# Patient Record
Sex: Male | Born: 1990 | Hispanic: No | Marital: Single | Smoking: Current every day smoker
Health system: Southern US, Community
[De-identification: ages and names within clinical notes are randomized; demographics above are authoritative.]

## PROBLEM LIST (undated history)

## (undated) DIAGNOSIS — F419 Anxiety disorder, unspecified: Secondary | ICD-10-CM

## (undated) DIAGNOSIS — L509 Urticaria, unspecified: Secondary | ICD-10-CM

## (undated) DIAGNOSIS — N189 Chronic kidney disease, unspecified: Secondary | ICD-10-CM

## (undated) DIAGNOSIS — J302 Other seasonal allergic rhinitis: Secondary | ICD-10-CM

## (undated) DIAGNOSIS — T7840XA Allergy, unspecified, initial encounter: Secondary | ICD-10-CM

## (undated) HISTORY — DX: Allergy, unspecified, initial encounter: T78.40XA

## (undated) HISTORY — DX: Urticaria, unspecified: L50.9

## (undated) HISTORY — PX: CYST REMOVAL NECK: SHX6281

---

## 2016-05-11 ENCOUNTER — Emergency Department (HOSPITAL_COMMUNITY)
Admission: EM | Admit: 2016-05-11 | Discharge: 2016-05-11 | Disposition: A | Discharge status: Home / Self Care | Payer: Managed Care, Other (non HMO) | Attending: Emergency Medicine | Admitting: Emergency Medicine

## 2016-05-11 ENCOUNTER — Emergency Department (HOSPITAL_COMMUNITY): Payer: Managed Care, Other (non HMO)

## 2016-05-11 ENCOUNTER — Encounter (HOSPITAL_COMMUNITY): Payer: Self-pay | Admitting: *Deleted

## 2016-05-11 DIAGNOSIS — Y999 Unspecified external cause status: Secondary | ICD-10-CM | POA: Diagnosis not present

## 2016-05-11 DIAGNOSIS — S61412A Laceration without foreign body of left hand, initial encounter: Secondary | ICD-10-CM

## 2016-05-11 DIAGNOSIS — Y929 Unspecified place or not applicable: Secondary | ICD-10-CM | POA: Diagnosis not present

## 2016-05-11 DIAGNOSIS — Y9389 Activity, other specified: Secondary | ICD-10-CM | POA: Insufficient documentation

## 2016-05-11 DIAGNOSIS — W260XXA Contact with knife, initial encounter: Secondary | ICD-10-CM | POA: Insufficient documentation

## 2016-05-11 DIAGNOSIS — F17200 Nicotine dependence, unspecified, uncomplicated: Secondary | ICD-10-CM | POA: Diagnosis not present

## 2016-05-11 HISTORY — DX: Other seasonal allergic rhinitis: J30.2

## 2016-05-11 MED ORDER — TETANUS-DIPHTH-ACELL PERTUSSIS 5-2.5-18.5 LF-MCG/0.5 IM SUSP
0.5000 mL | Freq: Once | INTRAMUSCULAR | Status: AC
  Administered 2016-05-11: 0.5 mL via INTRAMUSCULAR
  Filled 2016-05-11: qty 0.5

## 2016-05-11 MED ORDER — CEPHALEXIN 500 MG PO CAPS: 500.0000 mg | ORAL_CAPSULE | Freq: Four times a day (QID) | ORAL | 0 refills | Status: DC

## 2016-05-11 MED ORDER — LIDOCAINE-EPINEPHRINE 1 %-1:100000 IJ SOLN
10.0000 mL | Freq: Once | INTRAMUSCULAR | Status: AC
  Administered 2016-05-11: 10 mL via INTRADERMAL
  Filled 2016-05-11: qty 1

## 2016-05-11 NOTE — ED Notes (Signed)
Dahlia ClientHannah, PA at  Bedside suturing laceration at this time.

## 2016-05-11 NOTE — ED Provider Notes (Signed)
[facility]-EMERGENCY DEPT Provider Note   CSN: [id number] Arrival date & time: 05/11/16  4098  First Provider Contact:  First MD Initiated Contact with Patient 05/11/16 0154        History   Chief Complaint Chief Complaint  Patient presents with  . Laceration    HPI Carlos Bradford is a 25 y.o. male.  Carlos Bradford is a 25 y.o. male  with a hx of seasonal allergies presents to the Emergency Department complaining of acute, persistent, laceration to the left hand between the middle and ring fingers.  Denies numbness or weakness.  He reports he "stabbed" his hand with the knife while trying to cut a strawberry.  No aggravating or alleviating factors.     The history is provided by the patient and medical records. No language interpreter was used.  Laceration     Past Medical History:  Diagnosis Date  . Seasonal allergies     There are no active problems to display for this patient.   Past Surgical History:  Procedure Laterality Date  . CYST REMOVAL NECK         Home Medications    Prior to Admission medications   Medication Sig Start Date End Date Taking? Authorizing Provider  cephALEXin (KEFLEX) 500 MG capsule Take 1 capsule (500 mg total) by mouth 4 (four) times daily. 05/11/16   Dahlia Client Kalub Morillo, PA-C    Family History No family history on file.  Social History Social History  Substance Use Topics  . Smoking status: Current Some Day Smoker  . Smokeless tobacco: Never Used  . Alcohol use No     Allergies   Review of patient's allergies indicates no known allergies.   Review of Systems Review of Systems  Constitutional: Negative for fever.  Gastrointestinal: Negative for nausea and vomiting.  Skin: Positive for wound.  Allergic/Immunologic: Negative for immunocompromised state.  Neurological: Negative for weakness and numbness.  Hematological: Does not bruise/bleed easily.  Psychiatric/Behavioral: The patient is not nervous/anxious.      Physical  Exam Updated Vital Signs BP 121/75 (BP Location: Right Arm)   Pulse 67   Temp 98.5 F (36.9 C) (Oral)   Resp 18   Ht  (1.88 m)   Wt 77.1 kg   SpO2 97%   BMI 21.83 kg/m   Physical Exam  Constitutional: He is oriented to person, place, and time. He appears well-developed and well-nourished. No distress.  HENT:  Head: Normocephalic and atraumatic.  Eyes: Conjunctivae are normal. No scleral icterus.  Neck: Normal range of motion.  Cardiovascular: Normal rate, regular rhythm, normal heart sounds and intact distal pulses.   No murmur heard. Capillary refill < 3 sec  Pulmonary/Chest: Effort normal and breath sounds normal. No respiratory distress.  Musculoskeletal: Normal range of motion. He exhibits no edema.  Right hand: Full range of motion of all fingers of the right hand.  Dentition intact to dull and sharp in all fingers.  Strength 5/5 with flexion and extension of all fingers. Strong grip strength.  2.7cm laceration to the palm of the hand between the middle and ring fingers.    Neurological: He is alert and oriented to person, place, and time.  Skin: Skin is warm and dry. He is not diaphoretic.  Psychiatric: He has a normal mood and affect.  Nursing note and vitals reviewed.    ED Treatments / Results  Labs (all labs ordered are listed, but only abnormal results are displayed) Labs Reviewed - No data to display  EKG  EKG Interpretation None       Radiology Dg Hand 2 View Left  Result Date: 05/11/2016 CLINICAL DATA:  Hand injury. Stabbed anterior surface of hand with knife making smoothie; laceration. EXAM: LEFT HAND - 2 VIEW COMPARISON:  None. FINDINGS: There is no evidence of fracture or dislocation. There is no evidence of arthropathy or other focal bone abnormality. Soft tissues are unremarkable. No radiopaque foreign body. IMPRESSION: Negative radiographs of the left hand. Electronically Signed   By: Rubye Oaks M.D.   On: 05/11/2016 02:39     Procedures .Marland KitchenLaceration Repair Date/Time: 05/11/2016 3:43 AM Performed by: Dierdre Forth Authorized by: Dierdre Forth   Consent:    Consent obtained:  Verbal   Consent given by:  Patient   Risks discussed:  Infection and poor wound healing   Alternatives discussed:  No treatment Anesthesia (see MAR for exact dosages):    Anesthesia method:  Local infiltration   Local anesthetic:  Lidocaine 1% WITH epi Laceration details:    Location:  Hand   Hand location:  L palm   Length (cm):  2.7 Repair type:    Repair type:  Simple Pre-procedure details:    Preparation:  Patient was prepped and draped in usual sterile fashion Exploration:    Hemostasis achieved with:  Direct pressure and epinephrine   Wound exploration: wound explored through full range of motion     Contaminated: no   Treatment:    Area cleansed with:  Saline and soap and water   Amount of cleaning:  Standard   Irrigation solution:  Sterile saline and sterile water   Irrigation volume:    Irrigation method:  Syringe   Visualized foreign bodies/material removed: no   Skin repair:    Repair method:  Sutures   Suture size:  5-0   Suture material:  Prolene   Suture technique:  Simple interrupted   Number of sutures:  3 Approximation:    Approximation:  Close   Vermilion border: well-aligned   Post-procedure details:    Dressing:  Non-adherent dressing   Patient tolerance of procedure:  Tolerated well, no immediate complications   (including critical care time)  Medications Ordered in ED Medications  lidocaine-EPINEPHrine (XYLOCAINE W/EPI) 1 %-1:100000 (with pres) injection 10 mL (10 mLs Intradermal Given by Other 05/11/16 0302)  Tdap (BOOSTRIX) injection 0.5 mL (0.5 mLs Intramuscular Given 05/11/16 0303)     Initial Impression / Assessment and Plan / ED Course  I have reviewed the triage vital signs and the nursing notes.  Pertinent labs & imaging results that were available during my  care of the patient were reviewed by me and considered in my medical decision making (see chart for details).  Clinical Course  Value Comment By Time  DG Hand 2 View Left No foreign bodies or fractures Dierdre Forth, PA-C 08/06 0244    Pt with laceration to the hand.  NO clinical evidence of vascular or tendon damage.  Pressure irrigation performed. Wound explored and base of wound visualized in a bloodless field without evidence of foreign body.  Laceration occurred < 8 hours prior to repair which was well tolerated. Tdap updated.  Pt has no comorbidities to effect normal wound healing. Pt discharged with antibiotics.  Discussed suture home care with patient and answered questions. Pt to follow-up for wound check and suture removal in 7 days; they are to return to the ED sooner for signs of infection. Pt is hemodynamically stable with no complaints prior  to dc.    Final Clinical Impressions(s) / ED Diagnoses   Final diagnoses:  Hand laceration, left, initial encounter    New Prescriptions New Prescriptions   CEPHALEXIN (KEFLEX) 500 MG CAPSULE    Take 1 capsule (500 mg total) by mouth 4 (four) times daily.     Dahlia ClientHannah Masayuki Sakai, PA-C 05/11/16 [id number]    Tomasita CrumbleAdeleke Oni, MD 05/11/16 [phone]

## 2016-05-11 NOTE — ED Triage Notes (Signed)
Patient presents with cut to left palm of hand from knife while cutting a strawberry

## 2016-05-11 NOTE — Discharge Instructions (Signed)

## 2016-07-25 ENCOUNTER — Encounter: Payer: Self-pay | Admitting: Family Medicine

## 2016-07-25 ENCOUNTER — Encounter (INDEPENDENT_AMBULATORY_CARE_PROVIDER_SITE_OTHER): Payer: Self-pay

## 2016-07-25 ENCOUNTER — Ambulatory Visit (INDEPENDENT_AMBULATORY_CARE_PROVIDER_SITE_OTHER): Payer: Managed Care, Other (non HMO) | Admitting: Family Medicine

## 2016-07-25 VITALS — BP 110/78 | HR 97 | Resp 12 | Ht 74.0 in | Wt 177.5 lb

## 2016-07-25 DIAGNOSIS — R5383 Other fatigue: Secondary | ICD-10-CM | POA: Diagnosis not present

## 2016-07-25 DIAGNOSIS — J309 Allergic rhinitis, unspecified: Secondary | ICD-10-CM

## 2016-07-25 DIAGNOSIS — Z23 Encounter for immunization: Secondary | ICD-10-CM

## 2016-07-25 DIAGNOSIS — Z0001 Encounter for general adult medical examination with abnormal findings: Secondary | ICD-10-CM

## 2016-07-25 DIAGNOSIS — Z113 Encounter for screening for infections with a predominantly sexual mode of transmission: Secondary | ICD-10-CM

## 2016-07-25 DIAGNOSIS — J3089 Other allergic rhinitis: Secondary | ICD-10-CM | POA: Insufficient documentation

## 2016-07-25 DIAGNOSIS — Z0000 Encounter for general adult medical examination without abnormal findings: Secondary | ICD-10-CM

## 2016-07-25 MED ORDER — MONTELUKAST SODIUM 10 MG PO TABS: 10.0000 mg | ORAL_TABLET | Freq: Every day | ORAL | 2 refills | Status: DC

## 2016-07-25 NOTE — Progress Notes (Signed)
HPI:   Zaivion Kundrat is a 25 y.o. male, who is here today to establish care with me. He is requesting routine physical today, according to him, he arranged appt for a CPE.  Former PCP: N/A Last preventive routine visit: Many years ago.  He plays tennis once per week for 2-3 hours. He does not follow a healthful diet.  Hx of STD's: Denies. He is not currently sexually active. He also would like screening for STD's, states that he always wears condoms. He denies any dysuria, urethral discharged, known exposure, or genital lesions.  Lives alone.     Concerns today: fatigue, worse for the past month, not sure for how long it has been going on but he recalls he did not have it in 01/2016 when he started working. Fatigue is usually first thing in the morning when he wakes up. He feels like he did not sleep at all.  Sometimes he feels a "little" cold, usually when he does not get a good sleep, stays late frequently.  He denies any Hx of depression or anxiety. 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 He did counseling.  Loud snoring while in college 2016, attributed to be very sleepy after a few days of not sleeping enough.   Hx of allergic rhinitis, he is on Flonase nasal spray, started 2 months ago. He feels like his allergies are "bad", he would like to see a allergy specialist. + Frontal pressure headache and nasal congestion. + nose and eyes pruritus, sneezing.    He denies fever, abnormal wt loss, cough, dyspnea, abdominal pain, nausea, vomiting, changes in bowel habits, blood in stool, gross hematuria, arthralgias, myalgias, or skin rash. No headache, numbness, or weakness.   Review of Systems  Constitutional: Positive for fatigue. Negative for activity change, appetite change, chills, diaphoresis, fever and unexpected weight change.  HENT: Positive for postnasal drip, rhinorrhea and sneezing. Negative for dental problem, ear  pain, hearing loss, mouth sores, nosebleeds, sore throat, trouble swallowing and voice change.   Eyes: Positive for discharge and itching. Negative for pain, redness and visual disturbance.  Respiratory: Negative for apnea, cough, shortness of breath and wheezing.   Cardiovascular: Negative for chest pain, palpitations and leg swelling.  Gastrointestinal: Negative for abdominal pain, blood in stool, nausea and vomiting.       No changes in bowel habits.  Endocrine: Negative for cold intolerance, heat intolerance, polydipsia, polyphagia and polyuria.  Genitourinary: Negative for decreased urine volume, difficulty urinating, discharge, dysuria, frequency, genital sores, hematuria, penile swelling, testicular pain and urgency.  Musculoskeletal: Negative for arthralgias, back pain, joint swelling and myalgias.  Skin: Negative for color change and rash.  Allergic/Immunologic: Positive for environmental allergies.  Neurological: Positive for headaches. Negative for dizziness, tremors, syncope, facial asymmetry, speech difficulty, weakness and numbness.  Hematological: Negative for adenopathy. Does not bruise/bleed easily.  Psychiatric/Behavioral: Negative for confusion and sleep disturbance. The patient is nervous/anxious.       No current outpatient prescriptions on file prior to visit.   No current facility-administered medications on file prior to visit.      Past Medical History:  Diagnosis Date  . Allergy   . Seasonal allergies    No Known Allergies  Family History  Problem Relation Age of Onset  . Hypertension Father   . Stroke Paternal Grandmother     Social History   Social History  . Marital status: Single  Spouse name: N/A  . Number of children: N/A  . Years of education: N/A   Social History Main Topics  . Smoking status: Current Some Day Smoker  . Smokeless tobacco: Never Used  . Alcohol use No  . Drug use:     Types: Marijuana     Comment: in the past  .  Sexual activity: Not Asked   Other Topics Concern  . None   Social History Narrative  . None    Vitals:   07/25/16 1339  BP: 110/78  Pulse: 97  Resp: 12   O2 sat at RA 98%   Body mass index is 22.79 kg/m.     Physical Exam  Nursing note and vitals reviewed. Constitutional: He is oriented to person, place, and time. He appears well-developed and well-nourished. No distress.  HENT:  Head: Atraumatic.  Nose: Rhinorrhea present.  Mouth/Throat: Uvula is midline, oropharynx is clear and moist and mucous membranes are normal.  Nasal voice, post nasal drainage  Eyes: Conjunctivae and EOM are normal. Pupils are equal, round, and reactive to light.  Neck: Normal range of motion. No thyroid mass and no thyromegaly present.  Cardiovascular: Normal rate and regular rhythm.   No murmur heard. Pulses:      Dorsalis pedis pulses are 2+ on the right side, and 2+ on the left side.  Respiratory: Effort normal and breath sounds normal. No respiratory distress.  GI: Soft. He exhibits no mass. There is no tenderness.  Genitourinary:  Genitourinary Comments: No concerns.  Musculoskeletal: He exhibits no edema or tenderness.  No major deformities appreciated and no signs of synovitis.  Lymphadenopathy:    He has no cervical adenopathy.       Right: No supraclavicular adenopathy present.       Left: No supraclavicular adenopathy present.  Neurological: He is alert and oriented to person, place, and time. He has normal strength. No cranial nerve deficit or sensory deficit. Coordination and gait normal.  Skin: Skin is warm. No rash noted. No erythema.  Psychiatric: His speech is normal. His mood appears anxious. Cognition and memory are normal.  Well groomed, good eye contact.      ASSESSMENT AND PLAN:     Francesco SorKavish was seen today for establish care.  Diagnoses and all orders for this visit:  Routine general medical examination at a health care facility   We discussed the  importance of regular physical activity and healthy diet for prevention of chronic illness and/or complications. Preventive guidelines reviewed. He is not interested in urethral swab for GC/CT screening, denies symptoms and no sexual activity for 3-4 months; so ordered in urine sample.  Vaccination up to date Next CPE in 1-3 years.   -     HIV antibody (with reflex); Future  Other fatigue  We discussed possible causes, it seems like it may be related to allergies. Lab work will be done and further recommendations will be given accordingly.  -     CBC with Differential; Future -     Comprehensive metabolic panel; Future -     TSH; Future  Allergic rhinitis, unspecified chronicity, unspecified seasonality, unspecified trigger  Continue Flonase nasal spray bid 1 spray in each nostril. Nasal saline at night. Zyrtec 10 mg daily. Singulair 10 mg at bedtime.  Immunology evaluation will be arranged.  -     Ambulatory referral to Immunology -     montelukast (SINGULAIR) 10 MG tablet; Take 1 tablet (10 mg total) by mouth at bedtime.  Need for immunization against influenza -     Flu Vaccine QUAD 36+ mos IM  Screen for STD (sexually transmitted disease)  Education about STD prevention given. CT/GC I urine and HIV ordered.    -He will come back for morning labs in a few days.        Betty G. [place], MD  [facility]. [place] office.

## 2016-07-25 NOTE — Patient Instructions (Signed)
A few things to remember from today's visit:   Routine general medical examination at a health care facility - Plan: HIV antibody (with reflex)  Other fatigue - Plan: CBC with Differential, Comprehensive metabolic panel, TSH  Allergic rhinitis, unspecified chronicity, unspecified seasonality, unspecified trigger - Plan: Ambulatory referral to Immunology  It is a common symptom associated with multiple factors: psychologic,medications, systemic illness, sleep disorders,infections, and unknown causes. Some work-up can be done to evaluate for common causes as thyroid disease,anemia,diabetes, or abnormalities in calcium,potassium,or sodium. I think it is mainly related to allergies.  Regular physical activity as tolerated and a healthy diet is usually might help and usually recommended for chronic fatigue.   At least 150 minutes of moderate exercise per week, daily brisk walking for 15-30 min is a good exercise option. Healthy diet low in saturated (animal) fats and sweets and consisting of fresh fruits and vegetables, lean meats such as fish and white chicken and whole grains.  - Vaccines:  Tdap vaccine every 10 years.  Shingles vaccine recommended at age 25, could be given after 25 years of age but not sure about insurance coverage.  Pneumonia vaccines:  Prevnar 13 at 65 and Pneumovax at 2566.   -Screening recommendations for low/normal risk males:  Screening for diabetes at age 25-45 and every 3 years.    Lipid screening at 35 and every 3 years.   Colonoscopy for colon cancer screening at age 950 and until age 25.  Prostate cancer screening: some controversy, starts at 50.

## 2016-07-29 ENCOUNTER — Other Ambulatory Visit: Payer: Managed Care, Other (non HMO)

## 2016-07-30 ENCOUNTER — Other Ambulatory Visit (INDEPENDENT_AMBULATORY_CARE_PROVIDER_SITE_OTHER): Payer: Managed Care, Other (non HMO)

## 2016-07-30 DIAGNOSIS — Z113 Encounter for screening for infections with a predominantly sexual mode of transmission: Secondary | ICD-10-CM

## 2016-07-30 DIAGNOSIS — Z0000 Encounter for general adult medical examination without abnormal findings: Secondary | ICD-10-CM

## 2016-07-30 DIAGNOSIS — R5383 Other fatigue: Secondary | ICD-10-CM

## 2016-07-30 LAB — COMPREHENSIVE METABOLIC PANEL
ALK PHOS: 63 U/L (ref 39–117)
ALT: 16 U/L (ref 0–53)
AST: 22 U/L (ref 0–37)
Albumin: 4.8 g/dL (ref 3.5–5.2)
BUN: 11 mg/dL (ref 6–23)
CHLORIDE: 102 meq/L (ref 96–112)
CO2: 30 mEq/L (ref 19–32)
Calcium: 9.9 mg/dL (ref 8.4–10.5)
Creatinine, Ser: 0.87 mg/dL (ref 0.40–1.50)
GFR: 113.44 mL/min (ref >60.00)
GLUCOSE: 77 mg/dL (ref 70–99)
POTASSIUM: 4.2 meq/L (ref 3.5–5.1)
SODIUM: 139 meq/L (ref 135–145)
Total Bilirubin: 0.7 mg/dL (ref 0.2–1.2)
Total Protein: 7.9 g/dL (ref 6.0–8.3)

## 2016-07-30 LAB — CBC WITH DIFFERENTIAL/PLATELET
BASOS PCT: 0.5 % (ref 0.0–3.0)
Basophils Absolute: 0 10*3/uL (ref 0.0–0.1)
EOS PCT: 2.9 % (ref 0.0–5.0)
Eosinophils Absolute: 0.2 10*3/uL (ref 0.0–0.7)
HCT: 45.2 % (ref 39.0–52.0)
Hemoglobin: 15.2 g/dL (ref 13.0–17.0)
LYMPHS ABS: 1.5 10*3/uL (ref 0.7–4.0)
Lymphocytes Relative: 27.4 % (ref 12.0–46.0)
MCHC: 33.6 g/dL (ref 30.0–36.0)
MCV: 84.4 fl (ref 78.0–100.0)
MONO ABS: 0.5 10*3/uL (ref 0.1–1.0)
MONOS PCT: 9 % (ref 3.0–12.0)
NEUTROS ABS: 3.2 10*3/uL (ref 1.4–7.7)
NEUTROS PCT: 60.2 % (ref 43.0–77.0)
PLATELETS: 208 10*3/uL (ref 150.0–400.0)
RBC: 5.36 Mil/uL (ref 4.22–5.81)
RDW: 13.7 % (ref 11.5–15.5)
WBC: 5.3 10*3/uL (ref 4.0–10.5)

## 2016-07-30 LAB — TSH: TSH: 2.55 u[IU]/mL (ref 0.35–4.50)

## 2016-07-31 LAB — GC/CHLAMYDIA PROBE AMP
CT PROBE, AMP APTIMA: NOT DETECTED
GC Probe RNA: NOT DETECTED

## 2016-07-31 LAB — HIV ANTIBODY (ROUTINE TESTING W REFLEX): HIV 1&2 Ab, 4th Generation: NONREACTIVE

## 2016-08-26 ENCOUNTER — Ambulatory Visit (INDEPENDENT_AMBULATORY_CARE_PROVIDER_SITE_OTHER): Payer: Managed Care, Other (non HMO) | Admitting: Allergy and Immunology

## 2016-08-26 ENCOUNTER — Encounter: Payer: Self-pay | Admitting: Allergy and Immunology

## 2016-08-26 VITALS — BP 100/70 | HR 90 | Temp 98.4°F | Resp 16 | Ht 72.5 in | Wt 170.0 lb

## 2016-08-26 DIAGNOSIS — J3089 Other allergic rhinitis: Secondary | ICD-10-CM | POA: Diagnosis not present

## 2016-08-26 DIAGNOSIS — K9049 Malabsorption due to intolerance, not elsewhere classified: Secondary | ICD-10-CM

## 2016-08-26 DIAGNOSIS — H1010 Acute atopic conjunctivitis, unspecified eye: Secondary | ICD-10-CM | POA: Insufficient documentation

## 2016-08-26 DIAGNOSIS — Z720 Tobacco use: Secondary | ICD-10-CM

## 2016-08-26 DIAGNOSIS — H1013 Acute atopic conjunctivitis, bilateral: Secondary | ICD-10-CM | POA: Diagnosis not present

## 2016-08-26 MED ORDER — OLOPATADINE HCL 0.7 % OP SOLN: 1.0000 [drp] | OPHTHALMIC | 5 refills | Status: DC

## 2016-08-26 MED ORDER — MOMETASONE FUROATE 50 MCG/ACT NA SUSP: 2.0000 | Freq: Every day | NASAL | 1 refills | Status: DC

## 2016-08-26 MED ORDER — LEVOCETIRIZINE DIHYDROCHLORIDE 5 MG PO TABS
5.0000 mg | ORAL_TABLET | Freq: Every day | ORAL | 5 refills | Status: DC | PRN
Start: 2016-08-26 — End: 2018-02-26

## 2016-08-26 NOTE — Assessment & Plan Note (Signed)
   Treatment plan as outlined above for allergic rhinitis.  A prescription has been provided for Pazeo, one drop per eye daily as needed. 

## 2016-08-26 NOTE — Assessment & Plan Note (Signed)
Skin tests to select food allergens were negative today. The negative predictive value of food allergen skin testing is excellent (approximately 95%). While this does not appear to be an IgE mediated issue, skin testing does not rule out food intolerances or idiosyncratic reactions which may lend to symptoms. These etiologies are suggested when elimination of the responsible food leads to symptom resolution and re-introduction of the food is followed by the return of symptoms.   The patient has been encouraged to keep a careful symptom/food journal and eliminate any food suspected of correlating with symptoms.

## 2016-08-26 NOTE — Patient Instructions (Addendum)
Perennial and seasonal allergic rhinitis  Aeroallergen avoidance measures have been discussed and provided in written form.  A prescription has been provided for levocetirizine, 5mg  daily as needed.  A prescription has been provided for Nasonex nasal spray, one spray per nostril 1-2 times daily as needed. Proper nasal spray technique has been discussed and demonstrated.  I have also recommended nasal saline spray (i.e., Simply Saline) or nasal saline lavage (i.e., NeilMed) as needed prior to medicated nasal sprays. The risks and benefits of aeroallergen immunotherapy have been discussed. The patient is motivated to initiate immunotherapy if insurance coverage is favorable. He will let us know how he would like to proceed.  Allergic conjunctivitis  Treatment plan as outlined above for allergic rhinitis.  A prescription has been provided for Pazeo, one drop per eye daily as needed.  Food intolerance Skin tests to select food allergens were negative today. The negative predictive value of food allergen skin testing is excellent (approximately 95%). While this does not appear to be an IgE mediated issue, skin testing does not rule out food intolerances or idiosyncratic reactions which may lend to symptoms. These etiologies are suggested when elimination of the responsible food leads to symptom resolution and re-introduction of the food is followed by the return of symptoms.   The patient has been encouraged to keep a careful symptom/food journal and eliminate any food suspected of correlating with symptoms.   Tobacco use Spirometry is normal today.  Tobacco cessation is encouraged.   Return in about 4 months (around 12/24/2016), or if symptoms worsen or fail to improve.  Reducing Pollen Exposure  The American Academy of Allergy, Asthma and Immunology suggests the following steps to reduce your exposure to pollen during allergy seasons.    1. Do not hang sheets or clothing out to dry;  pollen may collect on these items. 2. Do not mow lawns or spend time around freshly cut grass; mowing stirs up pollen. 3. Keep windows closed at night.  Keep car windows closed while driving. 4. Minimize morning activities outdoors, a time when pollen counts are usually at their highest. 5. Stay indoors as much as possible when pollen counts or humidity is high and on windy days when pollen tends to remain in the air longer. 6. Use air conditioning when possible.  Many air conditioners have filters that trap the pollen spores. 7. Use a HEPA room air filter to remove pollen form the indoor air you breathe.   Control of House Dust Mite Allergen  House dust mites play a major role in allergic asthma and rhinitis.  They occur in environments with high humidity wherever human skin, the food for dust mites is found. High levels have been detected in dust obtained from mattresses, pillows, carpets, upholstered furniture, bed covers, clothes and soft toys.  The principal allergen of the house dust mite is found in its feces.  A gram of dust may contain 1,000 mites and 250,000 fecal particles.  Mite antigen is easily measured in the air during house cleaning activities.    1. Encase mattresses, including the box spring, and pillow, in an air tight cover.  Seal the zipper end of the encased mattresses with wide adhesive tape. 2. Wash the bedding in water of 130 degrees Farenheit weekly.  Avoid cotton comforters/quilts and flannel bedding: the most ideal bed covering is the dacron comforter. 3. Remove all upholstered furniture from the bedroom. 4. Remove carpets, carpet padding, rugs, and non-washable window drapes from the bedroom.  Wash  drapes weekly or use plastic window coverings. 5. Remove all non-washable stuffed toys from the bedroom.  Wash stuffed toys weekly. 6. Have the room cleaned frequently with a vacuum cleaner and a damp dust-mop.  The patient should not be in a room which is being cleaned and  should wait 1 hour after cleaning before going into the room. 7. Close and seal all heating outlets in the bedroom.  Otherwise, the room will become filled with dust-laden air.  An electric heater can be used to heat the room. Reduce indoor humidity to less than 50%.  Do not use a humidifier.  Control of Dog or Cat Allergen  Avoidance is the best way to manage a dog or cat allergy. If you have a dog or cat and are allergic to dog or cats, consider removing the dog or cat from the home. If you have a dog or cat but don't want to find it a new home, or if your family wants a pet even though someone in the household is allergic, here are some strategies that may help keep symptoms at bay:  1. Keep the pet out of your bedroom and restrict it to only a few rooms. Be advised that keeping the dog or cat in only one room will not limit the allergens to that room. 2. Don't pet, hug or kiss the dog or cat; if you do, wash your hands with soap and water. 3. High-efficiency particulate air (HEPA) cleaners run continuously in a bedroom or living room can reduce allergen levels over time. 4. Regular use of a high-efficiency vacuum cleaner or a central vacuum can reduce allergen levels. 5. Giving your dog or cat a bath at least once a week can reduce airborne allergen.  Control of Mold Allergen  Mold and fungi can grow on a variety of surfaces provided certain temperature and moisture conditions exist.  Outdoor molds grow on plants, decaying vegetation and soil.  The major outdoor mold, Alternaria and Cladosporium, are found in very high numbers during hot and dry conditions.  Generally, a late Summer - Fall peak is seen for common outdoor fungal spores.  Rain will temporarily lower outdoor mold spore count, but counts rise rapidly when the rainy period ends.  The most important indoor molds are Aspergillus and Penicillium.  Dark, humid and poorly ventilated basements are ideal sites for mold growth.  The next  most common sites of mold growth are the bathroom and the kitchen.  Outdoor MicrosoftMold Control 1. Use air conditioning and keep windows closed 2. Avoid exposure to decaying vegetation. 3. Avoid leaf raking. 4. Avoid grain handling. 5. Consider wearing a face mask if working in moldy areas.  Indoor Mold Control 1. Maintain humidity below 50%. 2. Clean washable surfaces with 5% bleach solution. 3. Remove sources e.g. Contaminated carpets.  Control of Cockroach Allergen  Cockroach allergen has been identified as an important cause of acute attacks of asthma, especially in urban settings.  There are fifty-five species of cockroach that exist in the [place], however only three, the [place], [place] and Oriental species produce allergen that can affect patients with Asthma.  Allergens can be obtained from fecal particles, egg casings and secretions from cockroaches.    1. Remove food sources. 2. Reduce access to water. 3. Seal access and entry points. 4. Spray runways with 0.5-1% Diazinon or Chlorpyrifos 5. Blow boric acid power under stoves and refrigerator. 6. Place bait stations (hydramethylnon) at feeding sites.

## 2016-08-26 NOTE — Assessment & Plan Note (Addendum)
   Aeroallergen avoidance measures have been discussed and provided in written form.  A prescription has been provided for levocetirizine, 5mg  daily as needed.  A prescription has been provided for Nasonex nasal spray, one spray per nostril 1-2 times daily as needed. Proper nasal spray technique has been discussed and demonstrated.  I have also recommended nasal saline spray (i.e., Simply Saline) or nasal saline lavage (i.e., NeilMed) as needed prior to medicated nasal sprays. The risks and benefits of aeroallergen immunotherapy have been discussed. The patient is motivated to initiate immunotherapy if insurance coverage is favorable. He will let us know how he would like to proceed.

## 2016-08-26 NOTE — Progress Notes (Signed)
New Patient Note  RE: Carlos Bradford MRN: [id number] DOB: 03/26/1991 Date of Office Visit: 08/26/2016  Referring provider: Swazil[place]Jordan, Betty G, MD Primary care provider: Betty [place]Jordan, MD  Chief Complaint: Allergic Rhinitis  and Food Intolerance   History of present illness: Carlos Bradford is a 25 y.o. male seen today in consultation requested by Betty [place]Jordan, MD.  He reports that [place] he has experienced increased nasal congestion, rhinorrhea, postnasal drainage, sneezing, nasal pruritus, ocular pruritus, and occasional sinus pressure.  These symptoms occur year around but tend to be more frequent and severe during the summertime.  Over-the-counter medications have failed to adequately relieve his symptoms.  He is also concerned about the possibility of food allergies because he experiences increased nasal congestion after consuming milk, eggs, or banana's.  He denies concomitant urticaria, angioedema, cardiopulmonary symptoms, or GI symptoms.  He has smoked cigarettes since 2012.  He currently smokes 1 cigarette per day on average.  He denies chest tightness, dyspnea, wheezing, or a history of asthma.   Assessment and plan: Perennial and seasonal allergic rhinitis  Aeroallergen avoidance measures have been discussed and provided in written form.  A prescription has been provided for levocetirizine, 5mg  daily as needed.  A prescription has been provided for Nasonex nasal spray, one spray per nostril 1-2 times daily as needed. Proper nasal spray technique has been discussed and demonstrated.  I have also recommended nasal saline spray (i.e., Simply Saline) or nasal saline lavage (i.e., NeilMed) as needed prior to medicated nasal sprays. The risks and benefits of aeroallergen immunotherapy have been discussed. The patient is motivated to initiate immunotherapy if insurance coverage is favorable. He will let us know how he would like to proceed.  Allergic  conjunctivitis  Treatment plan as outlined above for allergic rhinitis.  A prescription has been provided for Pazeo, one drop per eye daily as needed.  Food intolerance Skin tests to select food allergens were negative today. The negative predictive value of food allergen skin testing is excellent (approximately 95%). While this does not appear to be an IgE mediated issue, skin testing does not rule out food intolerances or idiosyncratic reactions which may lend to symptoms. These etiologies are suggested when elimination of the responsible food leads to symptom resolution and re-introduction of the food is followed by the return of symptoms.   The patient has been encouraged to keep a careful symptom/food journal and eliminate any food suspected of correlating with symptoms.   Tobacco use Spirometry is normal today.  Tobacco cessation is encouraged.   Meds ordered this encounter  Medications  . Olopatadine HCl (PAZEO) 0.7 % SOLN    Sig: Place 1 drop into both eyes 1 day or 1 dose.    Dispense:  1 Bottle    Refill:  5  . levocetirizine (XYZAL) 5 MG tablet    Sig: Take 1 tablet (5 mg total) by mouth daily as needed for allergies.    Dispense:  30 tablet    Refill:  5  . mometasone (NASONEX) 50 MCG/ACT nasal spray    Sig: Place 2 sprays into the nose daily.    Dispense:  17 g    Refill:  1    Diagnostics: Spirometry:  Normal with an FEV1 of 85% predicted.  Please see scanned spirometry results for details. Epicutaneous testing: Positive to cockroach and dust mite antigen. Intradermal testing: Positive to grass pollens, ragweed pollen, weed pollens, tree pollens, and molds. Food allergen  skin testing:  Negative despite a positive histamine control.    Physical examination: Blood pressure 100/70, pulse 90, temperature 98.4 F (36.9 C), temperature source Oral, resp. rate 16, height 6' 0.5" (1.842 m), weight 170 lb (77.1 kg), SpO2 98 %.  General: Alert, interactive, in no acute  distress. HEENT: TMs pearly gray, turbinates edematous with clear discharge, post-pharynx moderately erythematous. Neck: Supple without lymphadenopathy. Lungs: Clear to auscultation without wheezing, rhonchi or rales. CV: Normal S1, S2 without murmurs. Abdomen: Nondistended, nontender. Skin: Warm and dry, without lesions or rashes. Extremities:  No clubbing, cyanosis or edema. Neuro:   Grossly intact.  Review of systems:  Review of systems negative except as noted in HPI / PMHx or noted below: Review of Systems  Constitutional: Negative.   HENT: Negative.   Eyes: Negative.   Respiratory: Negative.   Cardiovascular: Negative.   Gastrointestinal: Negative.   Genitourinary: Negative.   Musculoskeletal: Negative.   Skin: Negative.   Neurological: Negative.   Endo/Heme/Allergies: Negative.   Psychiatric/Behavioral: Negative.     Past medical history:  Past Medical History:  Diagnosis Date  . Allergy   . Seasonal allergies   . Urticaria    When playing outside     Past surgical history:  Past Surgical History:  Procedure Laterality Date  . CYST REMOVAL NECK      Family history: Family History  Problem Relation Age of Onset  . Hypertension Father   . Stroke Paternal Grandmother     Social history: Social History   Social History  . Marital status: Single    Spouse name: N/A  . Number of children: N/A  . Years of education: N/A   Occupational History  . Not on file.   Social History Main Topics  . Smoking status: Current Every Day Smoker    Types: Cigarettes    Start date: 03/27/2011  . Smokeless tobacco: Current User  . Alcohol use No  . Drug use:     Types: Marijuana     Comment: in the past  . Sexual activity: Yes    Partners: Female    Birth control/ protection: Condom   Other Topics Concern  . Not on file   Social History Narrative  . No narrative on file   Environmental History: The patient lives in an old apartment which was renovated in  2017.  There are hardwood floors throughout and central air/heat.  He has no pets in the apartment.  He currently smokes one cigarette per day and started smoking in 2012.    Medication List       Accurate as of 08/26/16  6:57 PM. Always use your most recent med list.          levocetirizine 5 MG tablet Commonly known as:  XYZAL Take 1 tablet (5 mg total) by mouth daily as needed for allergies.   mometasone 50 MCG/ACT nasal spray Commonly known as:  NASONEX Place 2 sprays into the nose daily.   montelukast 10 MG tablet Commonly known as:  SINGULAIR Take 1 tablet (10 mg total) by mouth at bedtime.   Olopatadine HCl 0.7 % Soln Commonly known as:  PAZEO Place 1 drop into both eyes 1 day or 1 dose.       Known medication allergies: No Known Allergies  I appreciate the opportunity to take part in Carolyn's care. Please do not hesitate to contact me with questions.  Sincerely,   R. Jorene Guestarter Janayla Marik, MD

## 2016-08-26 NOTE — Assessment & Plan Note (Signed)
Spirometry is normal today.  Tobacco cessation is encouraged.

## 2017-02-18 IMAGING — CR DG HAND 2V*L*
2 series · 2 of 2 positions shown · non-contrast
Comparison: None.

CLINICAL DATA: Hand injury. Stabbed anterior surface of hand with
knife making smoothie; laceration.

EXAM:
LEFT HAND - 2 VIEW

[PA]
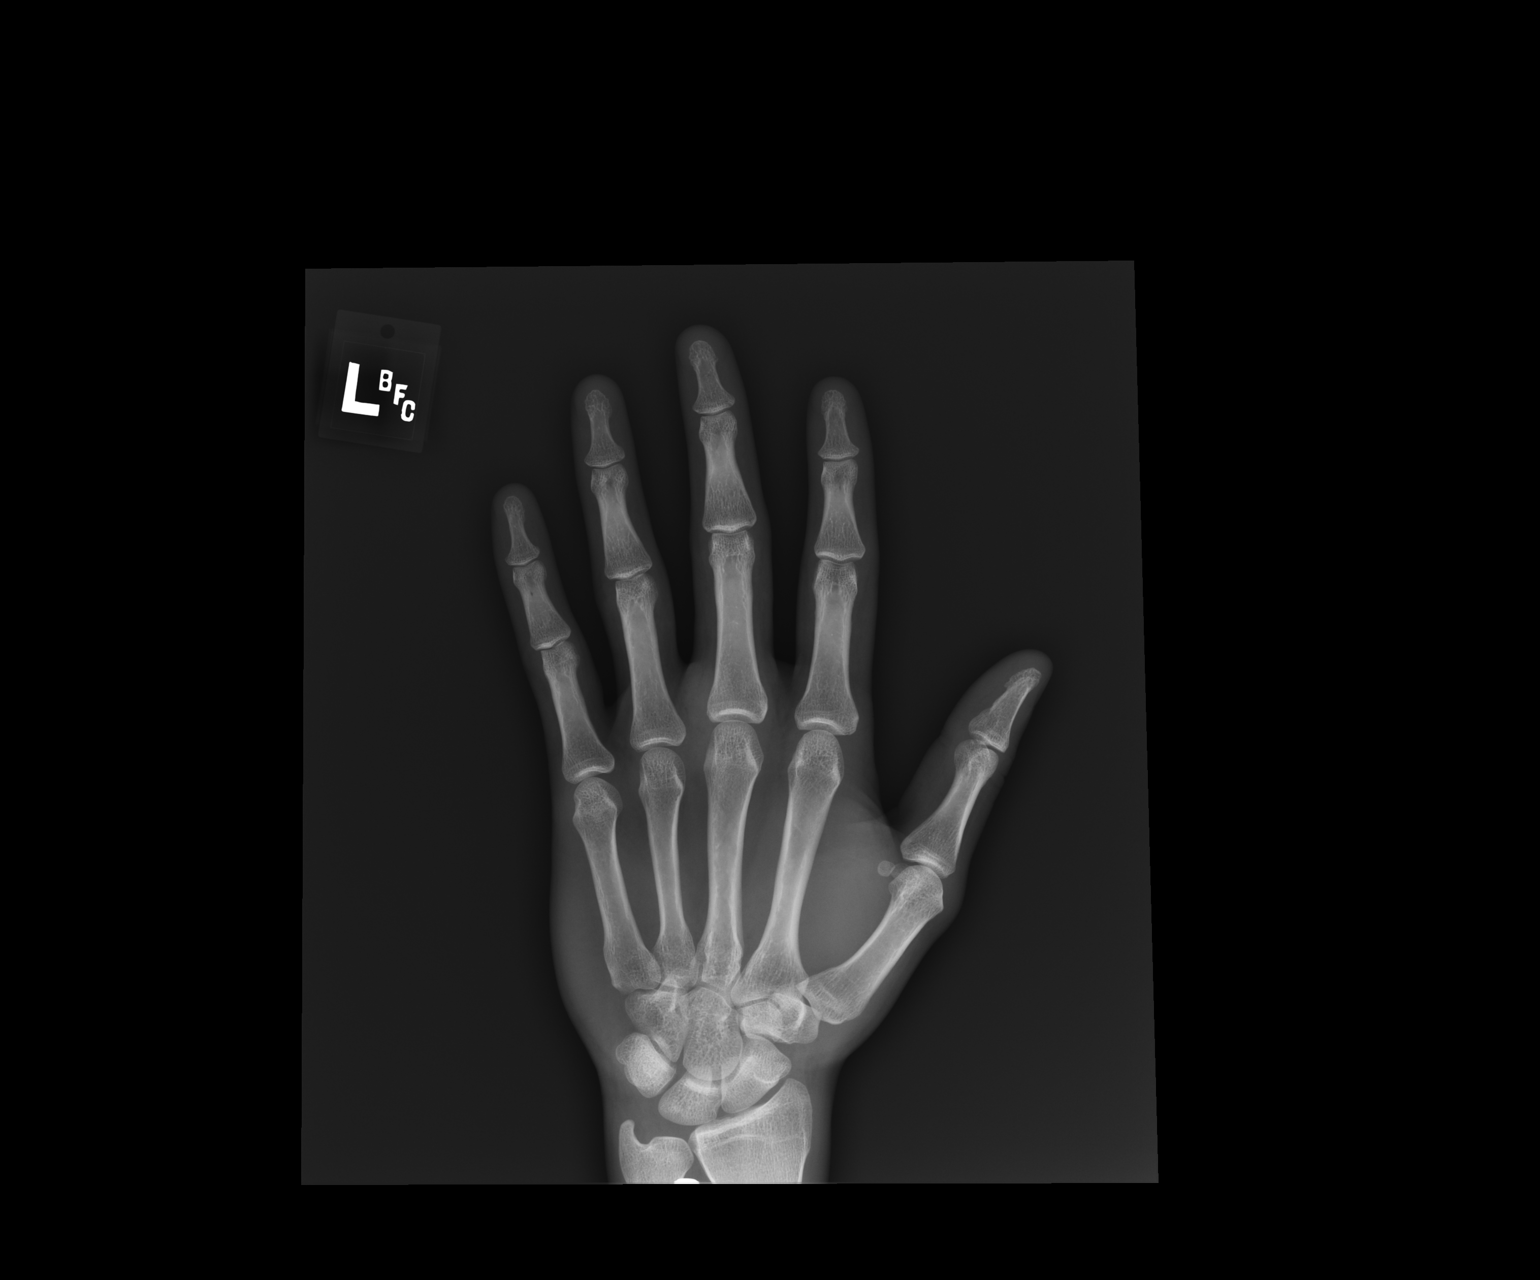

[lateral]
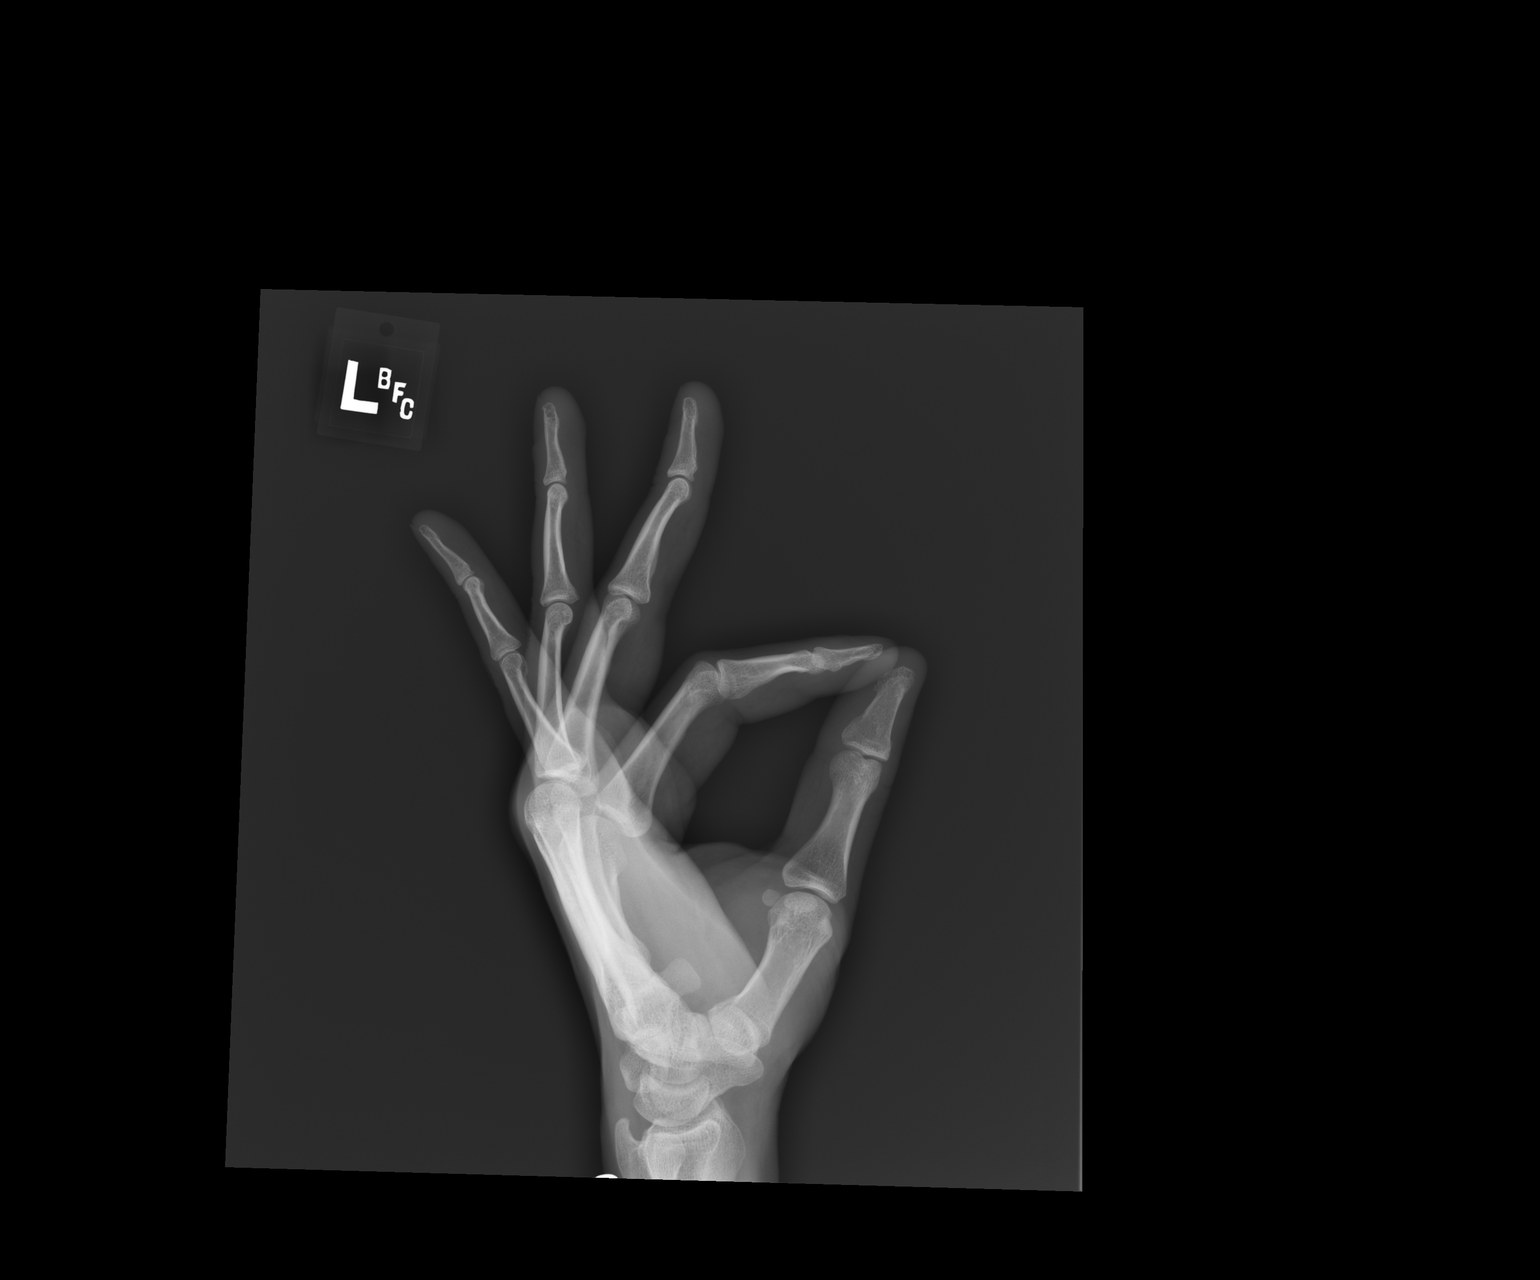

[2 of 2 positions shown; findings below may reference images not displayed]

FINDINGS: There is no evidence of fracture or dislocation. There is no
evidence of arthropathy or other focal bone abnormality. Soft
tissues are unremarkable. No radiopaque foreign body.
IMPRESSION: Negative radiographs of the left hand.

## 2017-02-24 ENCOUNTER — Telehealth: Payer: Self-pay | Admitting: Allergy and Immunology

## 2017-02-24 NOTE — Telephone Encounter (Signed)
patient has called to pay a balance - and it is a final notice. Patient states that it is $30 that he thought he had already paid There was a payment made vis [facility] on 01/26/2017 Patient is wondering why he is still getting a bill

## 2017-02-25 NOTE — Telephone Encounter (Signed)
Called pt - he has a zero bal at this time - kt

## 2017-07-27 NOTE — Progress Notes (Deleted)
     HPI:  Mr. Carlos Bradford is a 26 y.o.male here today for his routine physical examination.  Last CPE: 07/2016. He lives alone.  Regular exercise 3 or more times per week: Yes *** Following a healthful diet: ***   Chronic medical problems: Allergic rhinitis and food intolerance. He follows with Dr Nunzio CobbsBobbitt Chronic fatigue.   Hx of STD's: Denies  Immunization History  Administered Date(s) Administered  . Influenza,inj,Quad PF,6+ Mos 07/25/2016  . Tdap 05/11/2016    -Denies high alcohol intake or Hx of illicit drug use. + Smoker.  -Concerns and/or follow up today: ***    Review of Systems   Current Outpatient Prescriptions on File Prior to Visit  Medication Sig Dispense Refill  . levocetirizine (XYZAL) 5 MG tablet Take 1 tablet (5 mg total) by mouth daily as needed for allergies. 30 tablet 5  . mometasone (NASONEX) 50 MCG/ACT nasal spray Place 2 sprays into the nose daily. 17 g 1  . montelukast (SINGULAIR) 10 MG tablet Take 1 tablet (10 mg total) by mouth at bedtime. 30 tablet 2  . Olopatadine HCl (PAZEO) 0.7 % SOLN Place 1 drop into both eyes 1 day or 1 dose. 1 Bottle 5   No current facility-administered medications on file prior to visit.      Past Medical History:  Diagnosis Date  . Allergy   . Seasonal allergies   . Urticaria    When playing outside     Past Surgical History:  Procedure Laterality Date  . CYST REMOVAL NECK      No Known Allergies  Family History  Problem Relation Age of Onset  . Hypertension Father   . Stroke Paternal Grandmother     Social History   Social History  . Marital status: Single    Spouse name: N/A  . Number of children: N/A  . Years of education: N/A   Social History Main Topics  . Smoking status: Current Every Day Smoker    Types: Cigarettes    Start date: 03/27/2011  . Smokeless tobacco: Current User  . Alcohol use No  . Drug use: Yes    Types: Marijuana     Comment: in the past  . Sexual activity:  Yes    Partners: Female    Birth control/ protection: Condom   Other Topics Concern  . Not on file   Social History Narrative  . No narrative on file     There were no vitals filed for this visit. There is no height or weight on file to calculate BMI.   Wt Readings from Last 3 Encounters:  08/26/16 170 lb (77.1 kg)  07/25/16 177 lb 8 oz (80.5 kg)  05/11/16 170 lb (77.1 kg)      Physical Exam      ASSESSMENT AND PLAN:   Discussed the following assessment and plan:   There are no diagnoses linked to this encounter.       No Follow-up on file.    Starlee Corralejo G. [place]az[place]Jordan, MD  [facility]. [place] office.

## 2017-07-28 ENCOUNTER — Encounter: Payer: Self-pay | Admitting: Family Medicine

## 2017-07-28 DIAGNOSIS — Z0289 Encounter for other administrative examinations: Secondary | ICD-10-CM

## 2018-02-26 ENCOUNTER — Other Ambulatory Visit: Payer: Self-pay

## 2018-02-26 ENCOUNTER — Encounter (HOSPITAL_BASED_OUTPATIENT_CLINIC_OR_DEPARTMENT_OTHER): Payer: Self-pay | Admitting: *Deleted

## 2018-04-16 ENCOUNTER — Ambulatory Visit (HOSPITAL_BASED_OUTPATIENT_CLINIC_OR_DEPARTMENT_OTHER)
Admission: RE | Admit: 2018-04-16 | Payer: Managed Care, Other (non HMO) | Source: Ambulatory Visit | Admitting: Specialist

## 2018-04-16 HISTORY — DX: Chronic kidney disease, unspecified: N18.9

## 2018-04-16 HISTORY — DX: Anxiety disorder, unspecified: F41.9

## 2018-04-16 SURGERY — EXCISION, MASS, NECK
Anesthesia: General | Laterality: Left

## 2018-06-22 ENCOUNTER — Encounter: Payer: Self-pay | Admitting: Family Medicine

## 2018-06-22 ENCOUNTER — Ambulatory Visit: Payer: Managed Care, Other (non HMO) | Admitting: Family Medicine

## 2018-06-22 ENCOUNTER — Other Ambulatory Visit (HOSPITAL_COMMUNITY)
Admission: RE | Admit: 2018-06-22 | Discharge: 2018-06-22 | Disposition: A | Discharge status: Home / Self Care | Payer: Managed Care, Other (non HMO) | Source: Ambulatory Visit | Attending: Family Medicine | Admitting: Family Medicine

## 2018-06-22 VITALS — BP 112/70 | HR 62 | Temp 97.9°F | Resp 12 | Ht 73.0 in | Wt 173.2 lb

## 2018-06-22 DIAGNOSIS — Z7251 High risk heterosexual behavior: Secondary | ICD-10-CM | POA: Diagnosis not present

## 2018-06-22 DIAGNOSIS — Z113 Encounter for screening for infections with a predominantly sexual mode of transmission: Secondary | ICD-10-CM | POA: Diagnosis present

## 2018-06-22 DIAGNOSIS — R1032 Left lower quadrant pain: Secondary | ICD-10-CM

## 2018-06-22 LAB — URINALYSIS, ROUTINE W REFLEX MICROSCOPIC
BILIRUBIN URINE: NEGATIVE
HGB URINE DIPSTICK: NEGATIVE
KETONES UR: NEGATIVE
LEUKOCYTES UA: NEGATIVE
NITRITE: NEGATIVE
PH: 6 (ref 5.0–8.0)
RBC / HPF: NONE SEEN (ref 0–?)
Specific Gravity, Urine: 1.025 (ref 1.000–1.030)
Total Protein, Urine: NEGATIVE
UROBILINOGEN UA: 0.2 (ref 0.0–1.0)
Urine Glucose: NEGATIVE

## 2018-06-22 LAB — CBC
HEMATOCRIT: 44.3 % (ref 39.0–52.0)
HEMOGLOBIN: 15.1 g/dL (ref 13.0–17.0)
MCHC: 34.1 g/dL (ref 30.0–36.0)
MCV: 84.2 fl (ref 78.0–100.0)
Platelets: 207 10*3/uL (ref 150.0–400.0)
RBC: 5.26 Mil/uL (ref 4.22–5.81)
RDW: 13.8 % (ref 11.5–15.5)
WBC: 5.8 10*3/uL (ref 4.0–10.5)

## 2018-06-22 LAB — BASIC METABOLIC PANEL
BUN: 15 mg/dL (ref 6–23)
CHLORIDE: 102 meq/L (ref 96–112)
CO2: 26 mEq/L (ref 19–32)
Calcium: 9.4 mg/dL (ref 8.4–10.5)
Creatinine, Ser: 1.02 mg/dL (ref 0.40–1.50)
GFR: 93.04 mL/min (ref >60.00)
Glucose, Bld: 86 mg/dL (ref 70–99)
POTASSIUM: 4.1 meq/L (ref 3.5–5.1)
Sodium: 140 mEq/L (ref 135–145)

## 2018-06-22 NOTE — Patient Instructions (Addendum)
A few things to remember from today's visit:   Screen for STD (sexually transmitted disease) - Plan: HIV Antibody (routine testing w rflx), Urine cytology ancillary only, RPR, Hepatitis C antibody  LLQ abdominal pain - Plan: Urinalysis, Routine w reflex microscopic, Basic metabolic panel, CBC    Since pain has been stable for years, we can continue monitoring. We can also consider imaging, please let me know if you decide to do so.    Sexually Transmitted Disease A sexually transmitted disease (STD) is a disease or infection that may be passed (transmitted) from person to person, usually during sexual activity. This may happen by way of saliva, semen, blood, vaginal mucus, or urine. Common STDs include:  Gonorrhea.  Chlamydia.  Syphilis.  HIV and AIDS.  Genital herpes.  Hepatitis B and C.  Trichomonas.  Human papillomavirus (HPV).  Pubic lice.  Scabies.  Mites.  What are the causes? An STD may be caused by bacteria, a virus, or parasites. STDs are often transmitted during sexual activity if one person is infected. However, they may also be transmitted through nonsexual means. STDs may be transmitted after:  Sexual intercourse with an infected person.  Sharing sex toys with an infected person.  Sharing needles with an infected person or using unclean piercing or tattoo needles.  Having intimate contact with the genitals, mouth, or rectal areas of an infected person.  Exposure to infected fluids during birth.  What are the signs or symptoms? Different STDs have different symptoms. Some people may not have any symptoms. If symptoms are present, they may include:  Painful or bloody urination.  Pain in the pelvis, abdomen, vagina, anus, throat, or eyes.  A skin rash, itching, or irritation.  Growths, ulcerations, blisters, or sores in the genital and anal areas.  Abnormal vaginal discharge with or without bad odor.  Penile discharge in  men.  Fever.  Pain or bleeding during sexual intercourse.  Swollen glands in the groin area.  Yellow skin and eyes (jaundice). This is seen with hepatitis.  Swollen testicles.  Infertility.  Sores and blisters in the mouth.  How is this diagnosed? To make a diagnosis, your health care provider may:  Take a medical history.  Perform a physical exam.  Take a sample of any discharge to examine.  Swab the throat, cervix, opening to the penis, rectum, or vagina for testing.  Test a sample of your first morning urine.  Perform blood tests.  Perform a Pap test, if this applies.  Perform a colposcopy.  Perform a laparoscopy.  How is this treated? Treatment depends on the STD. Some STDs may be treated but not cured.  Chlamydia, gonorrhea, trichomonas, and syphilis can be cured with antibiotic medicine.  Genital herpes, hepatitis, and HIV can be treated, but not cured, with prescribed medicines. The medicines lessen symptoms.  Genital warts from HPV can be treated with medicine or by freezing, burning (electrocautery), or surgery. Warts may come back.  HPV cannot be cured with medicine or surgery. However, abnormal areas may be removed from the cervix, vagina, or vulva.  If your diagnosis is confirmed, your recent sexual partners need treatment. This is true even if they are symptom-free or have a negative culture or evaluation. They should not have sex until their health care providers say it is okay.  Your health care provider may test you for infection again 3 months after treatment.  How is this prevented? Take these steps to reduce your risk of getting an STD:  Use latex condoms, dental dams, and water-soluble lubricants during sexual activity. Do not use petroleum jelly or oils.  Avoid having multiple sex partners.  Do not have sex with someone who has other sex partners.  Do not have sex with anyone you do not know or who is at high risk for an STD.  Avoid  risky sex practices that can break your skin.  Do not have sex if you have open sores on your mouth or skin.  Avoid drinking too much alcohol or taking illegal drugs. Alcohol and drugs can affect your judgment and put you in a vulnerable position.  Avoid engaging in oral and anal sex acts.  Get vaccinated for HPV and hepatitis. If you have not received these vaccines in the past, talk to your health care provider about whether one or both might be right for you.  If you are at risk of being infected with HIV, it is recommended that you take a prescription medicine daily to prevent HIV infection. This is called pre-exposure prophylaxis (PrEP). You are considered at risk if: ? You are a man who has sex with other men (MSM). ? You are a heterosexual man or woman and are sexually active with more than one partner. ? You take drugs by injection. ? You are sexually active with a partner who has HIV.  Talk with your health care provider about whether you are at high risk of being infected with HIV. If you choose to begin PrEP, you should first be tested for HIV. You should then be tested every 3 months for as long as you are taking PrEP.  Contact a health care provider if:  See your health care provider.  Tell your sexual partner(s). They should be tested and treated for any STDs.  Do not have sex until your health care provider says it is okay. Get help right away if: Contact your health care provider right away if:  You have severe abdominal pain.  You are a man and notice swelling or pain in your testicles.  You are a woman and notice swelling or pain in your vagina.  This information is not intended to replace advice given to you by your health care provider. Make sure you discuss any questions you have with your health care provider. Document Released: 12/13/2002 Document Revised: 04/11/2016 Document Reviewed: 04/12/2013 Elsevier Interactive Patient Education  2018 [employer].

## 2018-06-22 NOTE — Progress Notes (Signed)
ACUTE VISIT   HPI:  Chief Complaint  Patient presents with  . STI check    just wants a check-up    [name] is a 27 y.o. male, who is here today concerned about STDs. In the past 2 months he has had 8 sexual partners. He usually wears condoms except for 2 occasions with same sexual partner. He is asymptomatic.   He was screened last in 07/2016.  He denies history of psychiatric/bipolar disorder. When asked about depression symptoms he states that he is "moody." No other risky behavior. Per records review Hx of marijuana use. +Smoker.  +Insomnia.  No suicidal thoughts.  He is also concerned about LLQ abdominal pain,which he has had intermittent for years. Sharp pain radiated to left testicle. "Very painful",it last < 10 min. Last episode about 2 months ago.  No masses, edema,or erythema. He has not identified exacerbating factors. It usually starts suddenly, he sits still  for a few minutes and pain resolves spontaneously.  Reporting Hx of nephrolithiasis. Denies dysuria,increased urinary frequency, gross hematuria,or decreased urine output.  Problem has been stable for years. He has not taken OTC medication. No Hx of trauma. No associated nausea,vomitig,changes in bowel habits,or blood in stool.    Review of Systems  Constitutional: Negative for appetite change, fatigue, fever and unexpected weight change.  HENT: Negative for mouth sores, sore throat and trouble swallowing.   Respiratory: Negative for cough, shortness of breath and wheezing.   Cardiovascular: Negative for palpitations and leg swelling.  Gastrointestinal: Positive for abdominal pain. Negative for blood in stool, constipation, nausea and vomiting.  Genitourinary: Negative for decreased urine volume, dysuria and hematuria.  Musculoskeletal: Negative for back pain and myalgias.  Skin: Negative for pallor and rash.  Neurological: Negative for syncope, weakness and headaches.    Psychiatric/Behavioral: Positive for sleep disturbance. Negative for confusion. The patient is nervous/anxious.       No current outpatient medications on file prior to visit.   No current facility-administered medications on file prior to visit.      Past Medical History:  Diagnosis Date  . Allergy   . Anxiety   . Chronic kidney disease    has kidney stones  . Seasonal allergies   . Urticaria    When playing outside    No Known Allergies  Social History   Socioeconomic History  . Marital status: Single    Spouse name: Not on file  . Number of children: Not on file  . Years of education: Not on file  . Highest education level: Not on file  Occupational History  . Not on file  Social Needs  . Financial resource strain: Not on file  . Food insecurity:    Worry: Not on file    Inability: Not on file  . Transportation needs:    Medical: Not on file    Non-medical: Not on file  Tobacco Use  . Smoking status: Current Every Day Smoker    Packs/day: 0.25    Types: Cigarettes    Start date: 03/27/2011  . Smokeless tobacco: Never Used  Substance and Sexual Activity  . Alcohol use: No  . Drug use: Yes    Types: Marijuana    Comment: last smoke 2 days ago  . Sexual activity: Yes    Partners: Female    Birth control/protection: Condom  Lifestyle  . Physical activity:    Days per week: Not on file    Minutes per session:  Not on file  . Stress: Not on file  Relationships  . Social connections:    Talks on phone: Not on file    Gets together: Not on file    Attends religious service: Not on file    Active member of club or organization: Not on file    Attends meetings of clubs or organizations: Not on file    Relationship status: Not on file  Other Topics Concern  . Not on file  Social History Narrative  . Not on file    Vitals:   06/22/18 0751  BP: 112/70  Pulse: 62  Resp: 12  Temp: 97.9 F (36.6 C)  SpO2: 98%   Body mass index is 22.86  kg/m.   Physical Exam  Nursing note and vitals reviewed. Constitutional: He is oriented to person, place, and time. He appears well-developed and well-nourished. No distress.  HENT:  Head: Normocephalic and atraumatic.  Mouth/Throat: Oropharynx is clear and moist and mucous membranes are normal.  Eyes: Conjunctivae and EOM are normal.  Cardiovascular: Normal rate and regular rhythm.  No murmur heard. Respiratory: Effort normal and breath sounds normal. No respiratory distress.  GI: Soft. He exhibits no mass. There is no hepatomegaly. There is no tenderness.  ? Hernia defect left inguinal with valsalva when palpating area.But no evident bulge area upon inspection. No abnormalities upon scrotal exam.    Genitourinary: Right testis shows no mass, no swelling and no tenderness. Left testis shows no mass, no swelling and no tenderness.  Genitourinary Comments: ? Left inguinal hermia  Musculoskeletal: He exhibits no edema.  Lymphadenopathy:       Right: No inguinal adenopathy present.       Left: No inguinal adenopathy present.  Neurological: He is alert and oriented to person, place, and time. He has normal strength. Gait normal.  Skin: Skin is warm. No erythema.  Psychiatric: He has a normal mood and affect.  Fairly groomed, good eye contact.      ASSESSMENT AND PLAN:  Mr. Carlos Bradford was seen today for sti check.  Diagnoses and all orders for this visit:  Lab Results  Component Value Date   WBC 5.8 06/22/2018   HGB 15.1 06/22/2018   HCT 44.3 06/22/2018   MCV 84.2 06/22/2018   PLT 207.0 06/22/2018   Lab Results  Component Value Date   CREATININE 1.02 06/22/2018   BUN 15 06/22/2018   NA 140 06/22/2018   K 4.1 06/22/2018   CL 102 06/22/2018   CO2 26 06/22/2018    Screen for STD (sexually transmitted disease)  Education about prevention and safe sex practices. Consistent condom use strongly recommended.  -     HIV Antibody (routine testing w rflx) -     Urine  cytology ancillary only -     RPR -     Hepatitis C antibody  LLQ abdominal pain  Possible etiologies discussed. Problem seems to be stable. Abdominal CT to evaluate for hernia vs surgeon evaluation but he prefers to hold on further work up for now. He was clearly instructed about warning signs.  -     Urinalysis, Routine w reflex microscopic -     Basic metabolic panel -     CBC  High risk sexual behavior, unspecified type  We discussed risks of such behavior.  He tells me that he was in a long relation (3 years) until a few months ago and now he is not interested in commitments. Denies Hx of bipolar disorder  but reports Hx of insomnia, "moody",and per records use of illicit drugs. He was counseled about sexual practices.     Return if symptoms worsen or fail to improve.      Tiffanye Hartmann G. [place], MD  [facility]. [place] office.

## 2018-06-23 LAB — URINE CYTOLOGY ANCILLARY ONLY
Chlamydia: NEGATIVE
Neisseria Gonorrhea: NEGATIVE
TRICH (WINDOWPATH): NEGATIVE

## 2018-06-24 ENCOUNTER — Telehealth: Payer: Self-pay | Admitting: Family Medicine

## 2018-06-24 ENCOUNTER — Encounter: Payer: Self-pay | Admitting: Family Medicine

## 2018-06-24 LAB — HEPATITIS C ANTIBODY
Hepatitis C Ab: NONREACTIVE
SIGNAL TO CUT-OFF: 0.01 (ref <1.00)

## 2018-06-24 LAB — HIV ANTIBODY (ROUTINE TESTING W REFLEX): HIV 1&2 Ab, 4th Generation: NONREACTIVE

## 2018-06-24 LAB — RPR: RPR Ser Ql: NONREACTIVE

## 2018-06-24 NOTE — Telephone Encounter (Signed)
Copied from CRM [id number]. Topic: Quick Communication - See Telephone Encounter >> Jun 24, 2018  3:21 PM Luanna Coleawoud, Jessica L wrote: CRM for notification. See Telephone encounter for: 06/24/18. Pt called and stated he would like lab results from 06/22/18. Please advise

## 2018-06-24 NOTE — Telephone Encounter (Signed)
Pt notified of lab results and verbalized understanding. No further action needed!

## 2018-06-24 NOTE — Telephone Encounter (Signed)
Left message to return phone call.

## 2018-06-25 NOTE — Telephone Encounter (Signed)
Pt was notified. No further action needed.

## 2018-08-05 ENCOUNTER — Ambulatory Visit (HOSPITAL_COMMUNITY)
Admission: EM | Admit: 2018-08-05 | Discharge: 2018-08-05 | Disposition: A | Discharge status: Home / Self Care | Payer: 59 | Attending: Family Medicine | Admitting: Family Medicine

## 2018-08-05 ENCOUNTER — Encounter (HOSPITAL_COMMUNITY): Payer: Self-pay

## 2018-08-05 ENCOUNTER — Other Ambulatory Visit: Payer: Self-pay

## 2018-08-05 DIAGNOSIS — F17210 Nicotine dependence, cigarettes, uncomplicated: Secondary | ICD-10-CM | POA: Diagnosis not present

## 2018-08-05 DIAGNOSIS — Z8249 Family history of ischemic heart disease and other diseases of the circulatory system: Secondary | ICD-10-CM | POA: Insufficient documentation

## 2018-08-05 DIAGNOSIS — J029 Acute pharyngitis, unspecified: Secondary | ICD-10-CM | POA: Insufficient documentation

## 2018-08-05 DIAGNOSIS — Z87442 Personal history of urinary calculi: Secondary | ICD-10-CM | POA: Insufficient documentation

## 2018-08-05 LAB — POCT RAPID STREP A: Streptococcus, Group A Screen (Direct): NEGATIVE

## 2018-08-05 MED ORDER — ACETAMINOPHEN 325 MG PO TABS
ORAL_TABLET | ORAL | Status: AC
  Filled 2018-08-05: qty 2

## 2018-08-05 MED ORDER — LIDOCAINE VISCOUS HCL 2 % MT SOLN
15.0000 mL | Freq: Once | OROMUCOSAL | Status: AC
  Administered 2018-08-05: 15 mL via OROMUCOSAL

## 2018-08-05 MED ORDER — LIDOCAINE VISCOUS HCL 2 % MT SOLN
OROMUCOSAL | Status: AC
  Filled 2018-08-05: qty 15

## 2018-08-05 MED ORDER — ACETAMINOPHEN 325 MG PO TABS
650.0000 mg | ORAL_TABLET | Freq: Once | ORAL | Status: AC
  Administered 2018-08-05: 650 mg via ORAL

## 2018-08-05 MED ORDER — AMOXICILLIN 500 MG PO CAPS: 500.0000 mg | ORAL_CAPSULE | Freq: Two times a day (BID) | ORAL | 0 refills | Status: AC

## 2018-08-05 NOTE — ED Provider Notes (Signed)
[facility]   [id number] 08/05/18 Arrival Time: 1843  QM:VHQI THROAT  SUBJECTIVE: History from: patient.  Carlos Bradford is a 27 y.o. male who presents with abrupt onset of sore throat for the past 2 days.  Denies positive sick exposure or precipitating event.  Has tried ibuprofen with minimal relief.  Symptoms are made worse with swallowing, but tolerating liquids and own secretions.  Reports previous symptoms in the past.   Denies fever, chills, fatigue, sinus pain, rhinorrhea, cough, SOB, wheezing, chest pain, nausea, rash, changes in bowel or bladder habits.     ROS: As per HPI.  Past Medical History:  Diagnosis Date  . Allergy   . Anxiety   . Chronic kidney disease    has kidney stones  . Seasonal allergies   . Urticaria    When playing outside    Past Surgical History:  Procedure Laterality Date  . CYST REMOVAL NECK     No Known Allergies No current facility-administered medications on file prior to encounter.    No current outpatient medications on file prior to encounter.   Social History   Socioeconomic History  . Marital status: Single    Spouse name: Not on file  . Number of children: Not on file  . Years of education: Not on file  . Highest education level: Not on file  Occupational History  . Not on file  Social Needs  . Financial resource strain: Not on file  . Food insecurity:    Worry: Not on file    Inability: Not on file  . Transportation needs:    Medical: Not on file    Non-medical: Not on file  Tobacco Use  . Smoking status: Current Every Day Smoker    Packs/day: 0.25    Types: Cigarettes    Start date: 03/27/2011  . Smokeless tobacco: Never Used  Substance and Sexual Activity  . Alcohol use: No  . Drug use: Yes    Types: Marijuana    Comment: last smoke 2 days ago  . Sexual activity: Yes    Partners: Female    Birth control/protection: Condom  Lifestyle  . Physical activity:    Days per week: Not on file    Minutes  per session: Not on file  . Stress: Not on file  Relationships  . Social connections:    Talks on phone: Not on file    Gets together: Not on file    Attends religious service: Not on file    Active member of club or organization: Not on file    Attends meetings of clubs or organizations: Not on file    Relationship status: Not on file  . Intimate partner violence:    Fear of current or ex partner: Not on file    Emotionally abused: Not on file    Physically abused: Not on file    Forced sexual activity: Not on file  Other Topics Concern  . Not on file  Social History Narrative  . Not on file   Family History  Problem Relation Age of Onset  . Hypertension Father   . Stroke Paternal Grandmother     OBJECTIVE:  Vitals:   08/05/18 1903 08/05/18 1905  BP: 130/85   Pulse: (!) 104   Resp: 18   Temp: (!) 102.2 F (39 C)   TempSrc: Oral   SpO2: 100%   Weight:  180 lb (81.6 kg)     General appearance: alert; appears fatigued, but nontoxic  HEENT: NCAT; Ears: EACs clear, TMs pearly gray; Eyes: PERRL.  EOM grossly intact.  Nose: no obvious rhinorrhea; Throat: tonsils 2+ and erythematous with white exudates, uvula midline  Neck: supple without LAD Lungs: CTA bilaterally without adventitious breath sounds Heart: regular rate and rhythm.  Radial pulses 2+ symmetrical bilaterally Skin: warm and dry Psychological: alert and cooperative; normal mood and affect  Labs: Results for orders placed or performed during the hospital encounter of 08/05/18 (from the past 24 hour(s))  POCT rapid strep A [facility])     Status: None   Collection Time: 08/05/18  7:20 PM  Result Value Ref Range   Streptococcus, Group A Screen (Direct) NEGATIVE NEGATIVE     ASSESSMENT & PLAN:  1. Viral pharyngitis     Meds ordered this encounter  Medications  . acetaminophen (TYLENOL) tablet 650 mg  . lidocaine (XYLOCAINE) 2 % viscous mouth solution 15 mL  . amoxicillin (AMOXIL) 500 MG capsule     Sig: Take 1 capsule (500 mg total) by mouth 2 (two) times daily for 10 days.    Dispense:  20 capsule    Refill:  0    Order Specific Question:   Supervising Provider    Answer:   Isa Rankin [[id number]]   Strep test negative, will send out for culture and we will call you with results Get plenty of rest and push fluids Amoxicillin prescribed for potential bacterial infection.  Take as directed and to completion Continue to alternate ibuprofen and tylenol every 6-8 hours for fever, and pain Follow up with PCP if symptoms persists Return or go to ER if patient has any new or worsening symptoms fever that does not moderate with ibuprofen or tylenol, worsening sore throat, nausea, vomiting, abdominal pain, etc...   Reviewed expectations re: course of current medical issues. Questions answered. Outlined signs and symptoms indicating need for more acute intervention. Patient verbalized understanding. After Visit Summary given.   Rennis Harding, PA-C 08/05/18 1945

## 2018-08-05 NOTE — Discharge Instructions (Addendum)
Viscous lidocaine given in office for sore throat Tylenol given in office Strep test negative, will send out for culture and we will call you with results Get plenty of rest and push fluids Amoxicillin prescribed for potential bacterial infection.  Take as directed and to completion Continue to alternate ibuprofen and tylenol every 6-8 hours for fever, and pain Follow up with PCP if symptoms persists Return or go to ER if patient has any new or worsening symptoms fever that does not moderate with ibuprofen or tylenol, worsening sore throat, nausea, vomiting, abdominal pain, etc..Marland Kitchen

## 2018-08-05 NOTE — ED Triage Notes (Signed)
Pt c/o fever and sore throat x 2 days.  

## 2018-08-08 LAB — CULTURE, GROUP A STREP (THRC)

## 2018-11-17 ENCOUNTER — Encounter: Payer: Self-pay | Admitting: Family Medicine

## 2018-11-19 ENCOUNTER — Ambulatory Visit: Payer: 59 | Admitting: Family Medicine

## 2018-11-19 DIAGNOSIS — Z0289 Encounter for other administrative examinations: Secondary | ICD-10-CM

## 2019-05-19 ENCOUNTER — Telehealth: Payer: 59 | Admitting: Family

## 2019-05-19 DIAGNOSIS — Z20822 Contact with and (suspected) exposure to covid-19: Secondary | ICD-10-CM

## 2019-05-19 NOTE — Progress Notes (Signed)
E-Visit for Corona Virus Screening   Your current symptoms could be consistent with the coronavirus.  Many health care providers can now test patients at their office but not all are.  [place] has multiple testing sites. For information on our COVID testing locations and hours go to [email or web address]  Please quarantine yourself while awaiting your test results.  We are enrolling you in our MyChart Home Montioring for COVID19 . Daily you will receive a questionnaire within the MyChart website. Our COVID 19 response team willl be monitoriing your responses daily.    COVID-19 is a respiratory illness with symptoms that are similar to the flu. Symptoms are typically mild to moderate, but there have been cases of severe illness and death due to the virus. The following symptoms may appear 2-14 days after exposure: . Fever . Cough . Shortness of breath or difficulty breathing . Chills . Repeated shaking with chills . Muscle pain . Headache . Sore throat . New loss of taste or smell . Fatigue . Congestion or runny nose . Nausea or vomiting . Diarrhea  It is vitally important that if you feel that you have an infection such as this virus or any other virus that you stay home and away from places where you may spread it to others.  You should self-quarantine for 14 days if you have symptoms that could potentially be coronavirus or have been in close contact a with a person diagnosed with COVID-19 within the last 2 weeks. You should avoid contact with people age 65 and older.   You should wear a mask or cloth face covering over your nose and mouth if you must be around other people or animals, including pets (even at home). Try to stay at least 6 feet away from other people. This will protect the people around you.  You may also take acetaminophen (Tylenol) as needed for fever.   Reduce your risk of any infection by using the same precautions used for avoiding the  common cold or flu:  . Wash your hands often with soap and warm water for at least 20 seconds.  If soap and water are not readily available, use an alcohol-based hand sanitizer with at least 60% alcohol.  . If coughing or sneezing, cover your mouth and nose by coughing or sneezing into the elbow areas of your shirt or coat, into a tissue or into your sleeve (not your hands). . Avoid shaking hands with others and consider head nods or verbal greetings only. . Avoid touching your eyes, nose, or mouth with unwashed hands.  . Avoid close contact with people who are sick. . Avoid places or events with large numbers of people in one location, like concerts or sporting events. . Carefully consider travel plans you have or are making. . If you are planning any travel outside or inside the [place], visit the CDC's Travelers' Health webpage for the latest health notices. . If you have some symptoms but not all symptoms, continue to monitor at home and seek medical attention if your symptoms worsen. . If you are having a medical emergency, call 911.  HOME CARE . Only take medications as instructed by your medical team. . Drink plenty of fluids and get plenty of rest. . A steam or ultrasonic humidifier can help if you have congestion.   GET HELP RIGHT AWAY IF YOU HAVE EMERGENCY WARNING SIGNS** FOR COVID-19. If you or someone is showing any of these signs seek emergency medical care immediately. Call   911 or proceed to your closest emergency facility if: . You develop worsening high fever. . Trouble breathing . Bluish lips or face . Persistent pain or pressure in the chest . New confusion . Inability to wake or stay awake . You cough up blood. . Your symptoms become more severe  **This list is not all possible symptoms. Contact your medical provider for any symptoms that are sever or concerning to you.   MAKE SURE YOU   Understand these instructions.  Will watch your condition.  Will get help right  away if you are not doing well or get worse.  Your e-visit answers were reviewed by a board certified advanced clinical practitioner to complete your personal care plan.  Depending on the condition, your plan could have included both over the counter or prescription medications.  If there is a problem please reply once you have received a response from your provider.  Your safety is important to us.  If you have drug allergies check your prescription carefully.    You can use MyChart to ask questions about today's visit, request a non-urgent call back, or ask for a work or school excuse for 24 hours related to this e-Visit. If it has been greater than 24 hours you will need to follow up with your provider, or enter a new e-Visit to address those concerns. You will get an e-mail in the next two days asking about your experience.  I hope that your e-visit has been valuable and will speed your recovery. Thank you for using e-visits.   Greater than 5 minutes, yet less than 10 minutes of time have been spent researching, coordinating, and implementing care for this patient today.  Thank you for the details you included in the comment boxes. Those details are very helpful in determining the best course of treatment for you and help us to provide the best care.  

## 2019-05-20 ENCOUNTER — Other Ambulatory Visit: Payer: Self-pay | Admitting: *Deleted

## 2019-05-20 DIAGNOSIS — Z20822 Contact with and (suspected) exposure to covid-19: Secondary | ICD-10-CM

## 2019-05-22 LAB — NOVEL CORONAVIRUS, NAA: SARS-CoV-2, NAA: NOT DETECTED
